# Patient Record
Sex: Male | Born: 1975 | Race: Black or African American | Hispanic: No | Marital: Single | Smoking: Current some day smoker
Health system: Southern US, Community
[De-identification: ages and names within clinical notes are randomized; demographics above are authoritative.]

---

## 1998-09-12 ENCOUNTER — Emergency Department (HOSPITAL_COMMUNITY): Admission: EM | Admit: 1998-09-12 | Discharge: 1998-09-12 | Payer: Self-pay | Admitting: Internal Medicine

## 2000-12-24 ENCOUNTER — Emergency Department (HOSPITAL_COMMUNITY): Admission: EM | Admit: 2000-12-24 | Discharge: 2000-12-24 | Payer: Self-pay | Admitting: Emergency Medicine

## 2006-07-02 ENCOUNTER — Encounter: Admission: RE | Admit: 2006-07-02 | Discharge: 2006-07-02 | Payer: Self-pay | Admitting: Gastroenterology

## 2006-11-13 ENCOUNTER — Emergency Department (HOSPITAL_COMMUNITY): Admission: EM | Admit: 2006-11-13 | Discharge: 2006-11-13 | Payer: Self-pay | Admitting: Emergency Medicine

## 2009-05-19 ENCOUNTER — Emergency Department (HOSPITAL_COMMUNITY): Admission: EM | Admit: 2009-05-19 | Discharge: 2009-05-19 | Payer: Self-pay | Admitting: Emergency Medicine

## 2009-05-23 ENCOUNTER — Ambulatory Visit: Payer: Self-pay | Admitting: *Deleted

## 2009-05-23 ENCOUNTER — Inpatient Hospital Stay (HOSPITAL_COMMUNITY): Admission: RE | Admit: 2009-05-23 | Discharge: 2009-05-26 | Payer: Self-pay | Admitting: *Deleted

## 2010-07-24 ENCOUNTER — Emergency Department (HOSPITAL_COMMUNITY): Admission: EM | Admit: 2010-07-24 | Discharge: 2010-07-24 | Payer: Self-pay | Admitting: Emergency Medicine

## 2010-09-16 IMAGING — CR DG CHEST 2V
2 series · 2 of 2 positions shown · non-contrast
Comparison: None.

CLINICAL DATA: Shortness of breath.  Smoker.

CHEST - 2 VIEW

[w chest pa]
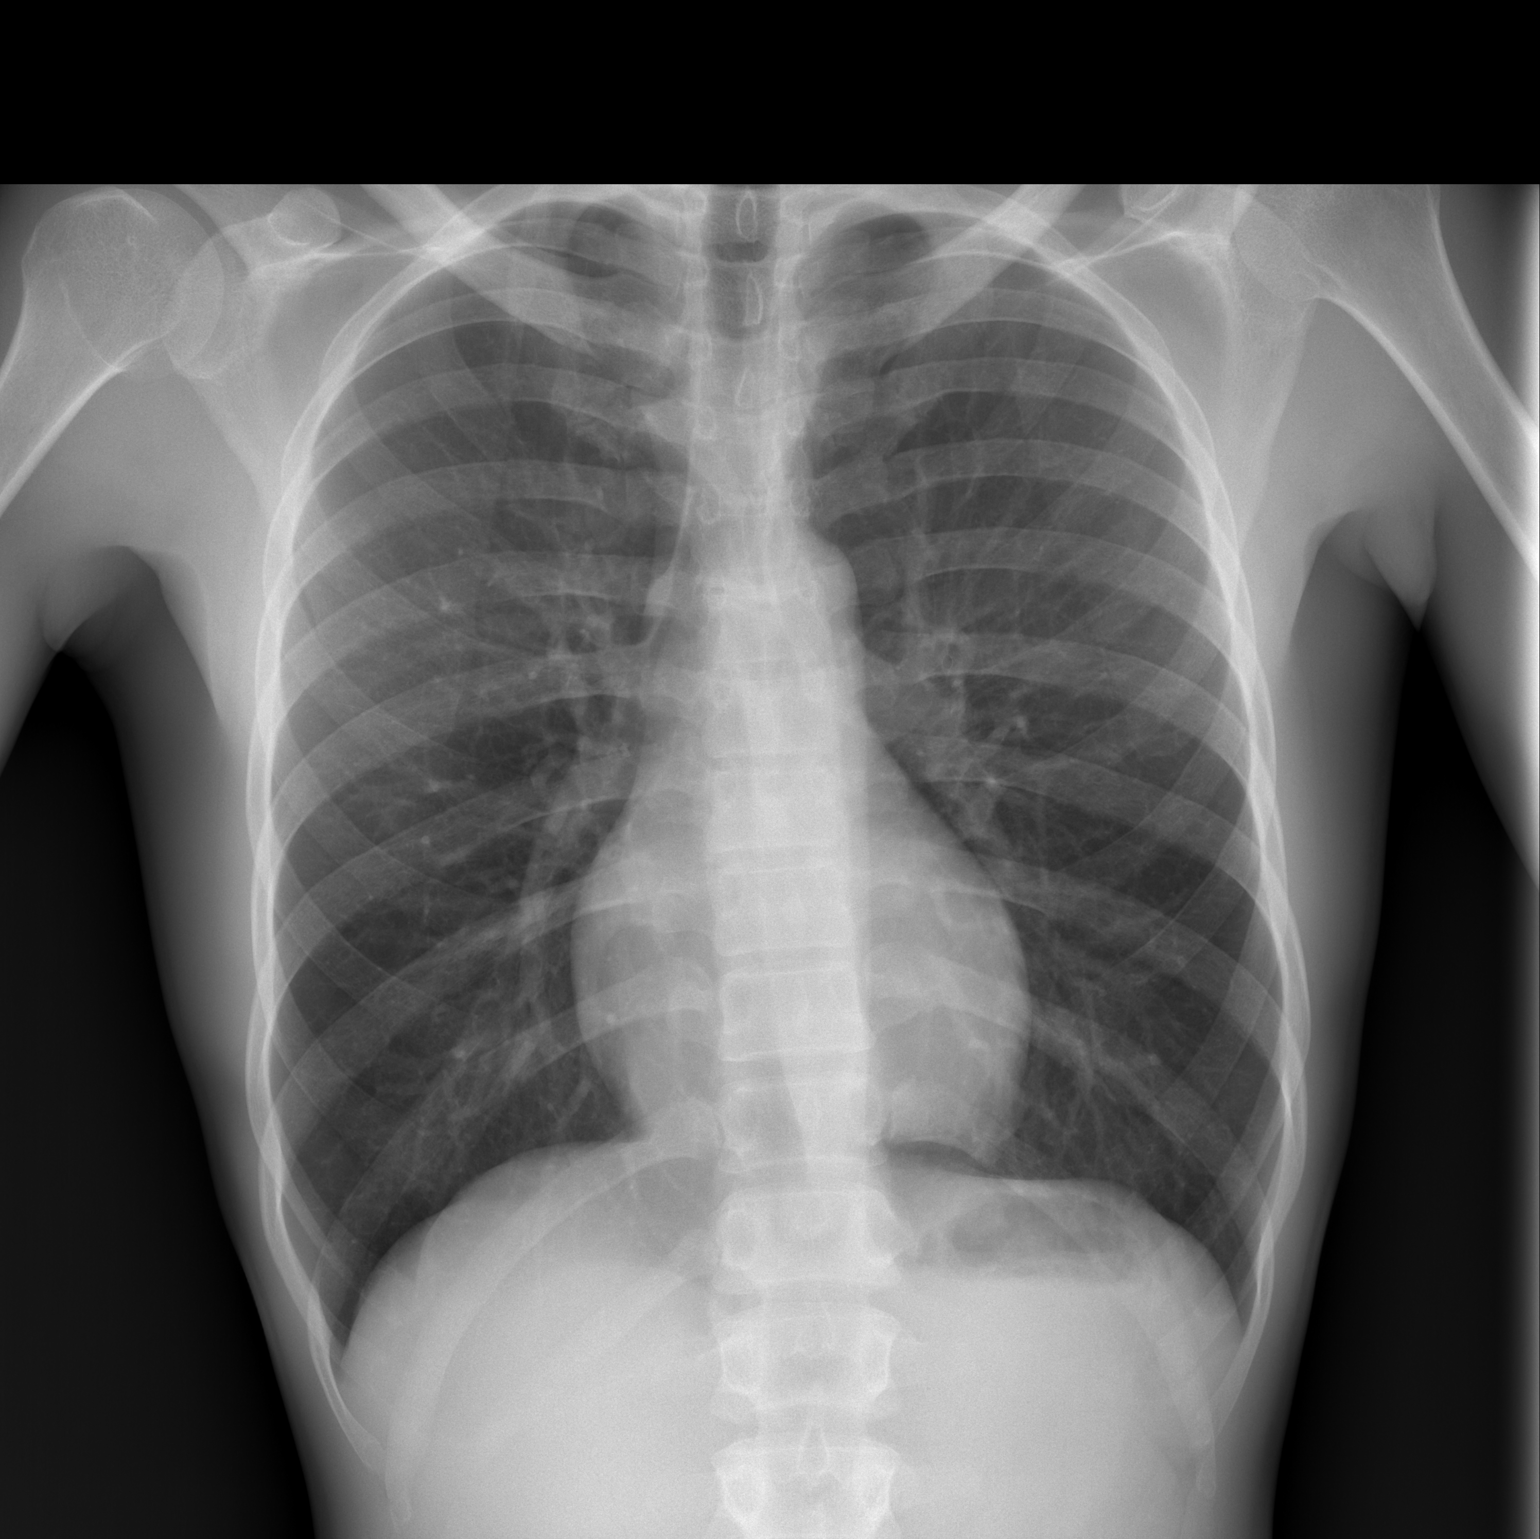

[w chest lat]
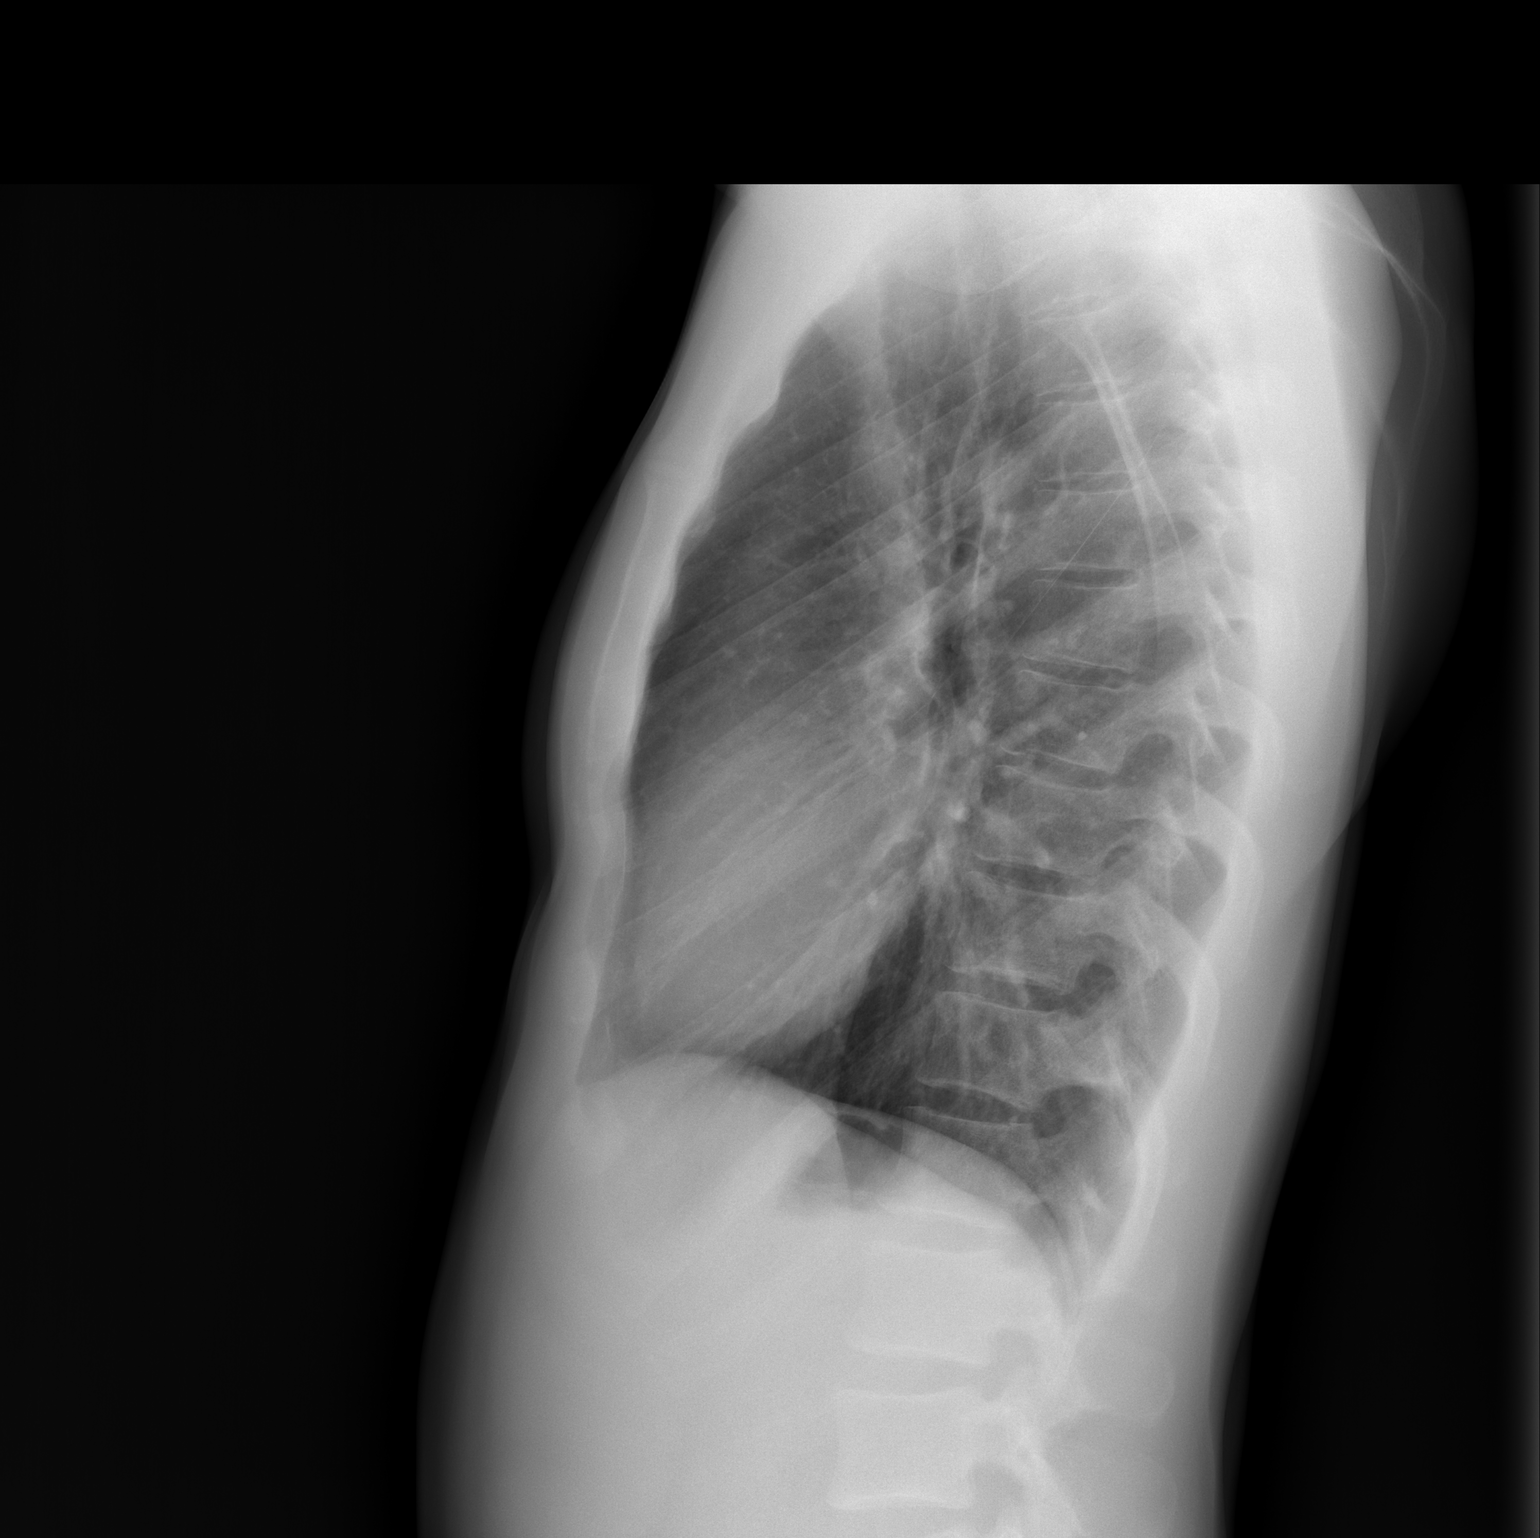

[2 of 2 positions shown; findings below may reference images not displayed]

FINDINGS: Normal sized heart.  Clear lungs.  Minimal central
peribronchial thickening and minimal hyperexpansion of the lungs.
Mild scoliosis.
IMPRESSION: Minimal changes of COPD and chronic bronchitis.  No acute
abnormality.

## 2010-12-21 LAB — CBC
MCHC: 33.1 g/dL (ref 30.0–36.0)
MCV: 84 fL (ref 78.0–100.0)
Platelets: 297 10*3/uL (ref 150–400)
RDW: 14 % (ref 11.5–15.5)

## 2010-12-21 LAB — COMPREHENSIVE METABOLIC PANEL
AST: 18 U/L (ref 0–37)
Albumin: 4.3 g/dL (ref 3.5–5.2)
CO2: 36 mEq/L — ABNORMAL HIGH (ref 19–32)
Calcium: 9.4 mg/dL (ref 8.4–10.5)
Creatinine, Ser: 1.04 mg/dL (ref 0.4–1.5)
GFR calc Af Amer: >60 mL/min (ref >60)
GFR calc non Af Amer: >60 mL/min (ref >60)
Total Protein: 7.5 g/dL (ref 6.0–8.3)

## 2012-07-24 ENCOUNTER — Emergency Department (HOSPITAL_COMMUNITY)
Admission: EM | Admit: 2012-07-24 | Discharge: 2012-07-24 | Disposition: A | Discharge status: Home / Self Care | Payer: 59 | Attending: Emergency Medicine | Admitting: Emergency Medicine

## 2012-07-24 ENCOUNTER — Encounter (HOSPITAL_COMMUNITY): Payer: Self-pay | Admitting: Emergency Medicine

## 2012-07-24 DIAGNOSIS — J029 Acute pharyngitis, unspecified: Secondary | ICD-10-CM | POA: Insufficient documentation

## 2012-07-24 DIAGNOSIS — F17200 Nicotine dependence, unspecified, uncomplicated: Secondary | ICD-10-CM | POA: Insufficient documentation

## 2012-07-24 DIAGNOSIS — J069 Acute upper respiratory infection, unspecified: Secondary | ICD-10-CM | POA: Insufficient documentation

## 2012-07-24 DIAGNOSIS — B370 Candidal stomatitis: Secondary | ICD-10-CM | POA: Insufficient documentation

## 2012-07-24 DIAGNOSIS — R6889 Other general symptoms and signs: Secondary | ICD-10-CM

## 2012-07-24 DIAGNOSIS — R197 Diarrhea, unspecified: Secondary | ICD-10-CM | POA: Insufficient documentation

## 2012-07-24 DIAGNOSIS — R52 Pain, unspecified: Secondary | ICD-10-CM | POA: Insufficient documentation

## 2012-07-24 DIAGNOSIS — R112 Nausea with vomiting, unspecified: Secondary | ICD-10-CM | POA: Insufficient documentation

## 2012-07-24 MED ORDER — IBUPROFEN 600 MG PO TABS: 600.0000 mg | ORAL_TABLET | Freq: Four times a day (QID) | ORAL | Status: DC | PRN

## 2012-07-24 MED ORDER — IBUPROFEN 800 MG PO TABS
800.0000 mg | ORAL_TABLET | Freq: Once | ORAL | Status: AC
  Administered 2012-07-24: 800 mg via ORAL
  Filled 2012-07-24: qty 1

## 2012-07-24 MED ORDER — FLUCONAZOLE 200 MG PO TABS: 200.0000 mg | ORAL_TABLET | Freq: Every day | ORAL | Status: AC

## 2012-07-24 MED ORDER — ONDANSETRON HCL 4 MG PO TABS: 4.0000 mg | ORAL_TABLET | Freq: Four times a day (QID) | ORAL | Status: DC

## 2012-07-24 MED ORDER — BENADRYL 25 MG PO TABS: 25.0000 mg | ORAL_TABLET | Freq: Four times a day (QID) | ORAL | Status: DC | PRN

## 2012-07-24 MED ORDER — IBUPROFEN 800 MG PO TABS: 800.0000 mg | ORAL_TABLET | Freq: Once | ORAL | Status: DC

## 2012-07-24 MED ORDER — ALBUTEROL SULFATE HFA 108 (90 BASE) MCG/ACT IN AERS
2.0000 | INHALATION_SPRAY | RESPIRATORY_TRACT | Status: DC | PRN
  Administered 2012-07-24: 2 via RESPIRATORY_TRACT
  Filled 2012-07-24: qty 6.7

## 2012-07-24 NOTE — ED Notes (Signed)
Pt presenting to ed with c/o body aches and pt states he can't sleep x 2 days. Pt states he feels like he has mucus in his chest but he has non-productive cough.

## 2012-07-24 NOTE — ED Provider Notes (Signed)
History     CSN: [id number]  Arrival date & time 07/24/12  1415   First MD Initiated Contact with Patient 07/24/12 1502      Chief Complaint  Patient presents with  . bodyaches   . flulike symptoms    (Consider location/radiation/quality/duration/timing/severity/associated sxs/prior treatment) Patient is a 36 y.o. male presenting with URI. The history is provided by the patient. No language interpreter was used.  URI The primary symptoms include fever, fatigue, ear pain, sore throat, cough, nausea, vomiting and arthralgias. Primary symptoms do not include headaches, swollen glands, wheezing or abdominal pain. The current episode started 3 to 5 days ago. This is a new problem. The problem has been gradually improving.  Symptoms associated with the illness include plugged ear sensation, sinus pressure and rhinorrhea. The illness is not associated with chills, facial pain or congestion.   36yo male with n/v x 1 diarrhea x 1 which has resolved. Also upper respiratory symptoms, low grade fever, rhinorrhea, sore throat, sneezing, ear fullness, and cough, insomnia x 3 days.  Took nyquil 3 days ago with some relief.   No pcp. Smoker.  Non toxic appearance.   History reviewed. No pertinent past medical history.  History reviewed. No pertinent past surgical history.  No family history on file.  History  Substance Use Topics  . Smoking status: Current Some Day Smoker  . Smokeless tobacco: Not on file  . Alcohol Use: Yes     Comment: rarely      Review of Systems  Constitutional: Positive for fever and fatigue. Negative for chills.  HENT: Positive for ear pain, sore throat, rhinorrhea and sinus pressure. Negative for congestion.   Eyes: Negative.   Respiratory: Positive for cough. Negative for wheezing.   Cardiovascular: Negative.   Gastrointestinal: Positive for nausea, vomiting and diarrhea. Negative for abdominal pain and constipation.  Musculoskeletal: Positive for arthralgias.  Negative for back pain.  Neurological: Negative.  Negative for headaches.  Psychiatric/Behavioral: Negative.   All other systems reviewed and are negative.    Allergies  Review of patient's allergies indicates no known allergies.  Home Medications   Current Outpatient Rx  Name  Route  Sig  Dispense  Refill  . NYQUIL MULTI-SYMPTOM PO   Oral   Take 2 capsules by mouth once.           BP 120/80  Pulse 99  Temp 99.6 F (37.6 C) (Oral)  Resp 20  SpO2 99%  Physical Exam  Nursing note and vitals reviewed. Constitutional: He is oriented to person, place, and time. He appears well-developed and well-nourished.  HENT:  Head: Normocephalic.  Eyes: Conjunctivae normal and EOM are normal. Pupils are equal, round, and reactive to light.  Neck: Normal range of motion. Neck supple.  Cardiovascular: Normal rate.   Pulmonary/Chest: Effort normal. No respiratory distress. He has no wheezes.       Course bs   Abdominal: Soft. Bowel sounds are normal. He exhibits no distension.  Musculoskeletal: Normal range of motion. He exhibits tenderness.       General Body aches   Neurological: He is alert and oriented to person, place, and time.  Skin: Skin is warm and dry.  Psychiatric: He has a normal mood and affect.    ED Course  Procedures (including critical care time)  Labs Reviewed - No data to display No results found.   No diagnosis found.    MDM  36yo male with Upper respiratory symptoms and general body aches. Bronchitis and  oral thursh.  Ibuprofen, benadryl and inhaler and diflucan.    Understands to return for SOB, fever n/v.  Follow up with pcp of choice as needed.  Non toxic appearance.         Remi Haggard, NP 07/25/12 [phone]

## 2012-07-25 NOTE — ED Provider Notes (Signed)
Medical screening examination/treatment/procedure(s) were performed by non-physician practitioner and as supervising physician I was immediately available for consultation/collaboration.  Victorino Fatzinger, MD 07/25/12 1523 

## 2017-05-01 ENCOUNTER — Ambulatory Visit (HOSPITAL_COMMUNITY)
Admission: RE | Admit: 2017-05-01 | Discharge: 2017-05-01 | Disposition: A | Discharge status: Home / Self Care | Payer: Federal, State, Local not specified - Other | Attending: Psychiatry | Admitting: Psychiatry

## 2017-05-01 ENCOUNTER — Emergency Department (HOSPITAL_COMMUNITY)
Admission: EM | Admit: 2017-05-01 | Discharge: 2017-05-02 | Disposition: A | Discharge status: Home / Self Care | Payer: Self-pay | Attending: Emergency Medicine | Admitting: Emergency Medicine

## 2017-05-01 DIAGNOSIS — F329 Major depressive disorder, single episode, unspecified: Secondary | ICD-10-CM | POA: Insufficient documentation

## 2017-05-01 DIAGNOSIS — F1414 Cocaine abuse with cocaine-induced mood disorder: Secondary | ICD-10-CM | POA: Diagnosis present

## 2017-05-01 DIAGNOSIS — R45851 Suicidal ideations: Secondary | ICD-10-CM | POA: Insufficient documentation

## 2017-05-01 DIAGNOSIS — F333 Major depressive disorder, recurrent, severe with psychotic symptoms: Secondary | ICD-10-CM | POA: Insufficient documentation

## 2017-05-01 DIAGNOSIS — F17210 Nicotine dependence, cigarettes, uncomplicated: Secondary | ICD-10-CM | POA: Insufficient documentation

## 2017-05-01 DIAGNOSIS — Z1389 Encounter for screening for other disorder: Secondary | ICD-10-CM | POA: Insufficient documentation

## 2017-05-01 NOTE — ED Provider Notes (Signed)
[facility]-EMERGENCY DEPT Provider Note   CSN: [id number] Arrival date & time: 05/01/17  2149    History   Chief Complaint Chief Complaint  Patient presents with  . Depression    HPI Nathan Kent is a 41 y.o. male.  41 year old male presents to the emergency department for worsening depression. He was seen at behavioral health earlier today and recommended for inpatient treatment. No beds available. He denies any suicidal or homicidal thoughts. No auditory or visual hallucinations. He denies pain. He reports injury hours that he is "just tired of being depressed". He states that he was on medication previously, but discontinued this some time ago. He has no additional complaints today. Patient calm and cooperative.      No past medical history on file.  There are no active problems to display for this patient.   No past surgical history on file.    Home Medications    Prior to Admission medications   Medication Sig Start Date End Date Taking? Authorizing Provider  BENADRYL 25 MG tablet Take 1 tablet (25 mg total) by mouth every 6 (six) hours as needed for sleep. Patient not taking: Reported on 05/01/2017 07/24/12   Jethro Bastos, NP  ibuprofen (ADVIL,MOTRIN) 600 MG tablet Take 1 tablet (600 mg total) by mouth every 6 (six) hours as needed for pain. Patient not taking: Reported on 05/01/2017 07/24/12   Jethro Bastos, NP  ondansetron (ZOFRAN) 4 MG tablet Take 1 tablet (4 mg total) by mouth every 6 (six) hours. Patient not taking: Reported on 05/01/2017 07/24/12   Jethro Bastos, NP    Family History No family history on file.  Social History Social History  Substance Use Topics  . Smoking status: Current Some Day Smoker  . Smokeless tobacco: Not on file  . Alcohol use Yes     Comment: rarely     Allergies   Patient has no known allergies.   Review of Systems Review of Systems Ten systems reviewed and are negative for acute change, except as noted in the HPI.      Physical Exam Updated Vital Signs BP 130/90 (BP Location: Right Arm)   Pulse 83   Resp 14   Ht 5\' 10"  (1.778 m)   Wt 67.6 kg (149 lb)   SpO2 100%   BMI 21.38 kg/m   Physical Exam  Constitutional: He is oriented to person, place, and time. He appears well-developed and well-nourished. No distress.  HENT:  Head: Normocephalic and atraumatic.  Eyes: Conjunctivae and EOM are normal. No scleral icterus.  Neck: Normal range of motion.  Cardiovascular: Normal rate, regular rhythm and intact distal pulses.   Pulmonary/Chest: Effort normal. No respiratory distress.  Musculoskeletal: Normal range of motion.  Neurological: He is alert and oriented to person, place, and time. He exhibits normal muscle tone. Coordination normal.  Skin: Skin is warm and dry. No rash noted. He is not diaphoretic. No erythema. No pallor.  Psychiatric: His behavior is normal. He exhibits a depressed mood.  No SI/HI  Nursing note and vitals reviewed.    ED Treatments / Results  Labs (all labs ordered are listed, but only abnormal results are displayed) Labs Reviewed  CBC WITH DIFFERENTIAL/PLATELET - Abnormal; Notable for the following:       Result Value   Hemoglobin 12.2 (*)    HCT 35.4 (*)    MCV 76.1 (*)    All other components within normal limits  COMPREHENSIVE METABOLIC PANEL - Abnormal; Notable  for the following:    Calcium 8.1 (*)    Total Protein 5.4 (*)    Albumin 3.2 (*)    AST 12 (*)    All other components within normal limits  ACETAMINOPHEN LEVEL - Abnormal; Notable for the following:    Acetaminophen (Tylenol), Serum <10 (*)    All other components within normal limits  ETHANOL  SALICYLATE LEVEL  RAPID URINE DRUG SCREEN, HOSP PERFORMED    EKG  EKG Interpretation None       Radiology No results found.  Procedures Procedures (including critical care time)  Medications Ordered in ED Medications - No data to display   Initial Impression / Assessment and Plan / ED  Course  I have reviewed the triage vital signs and the nursing notes.  Pertinent labs & imaging results that were available during my care of the patient were reviewed by me and considered in my medical decision making (see chart for details).     41 year old male presents for medical clearance after evaluation at behavioral health. He is pending inpatient placement for depression management. No SI/HI. The patient has been medically cleared. Disposition to be determined by oncoming ED provider   Final Clinical Impressions(s) / ED Diagnoses   Final diagnoses:  Depression, unspecified depression type    New Prescriptions New Prescriptions   No medications on file     Antony MaduraHumes, Aaima Gaddie, PA-C 05/02/17 0215    Melene PlanFloyd, Dan, DO 05/02/17 [phone]

## 2017-05-01 NOTE — ED Triage Notes (Signed)
Pt c/o of worsening depression and anxiety intermittently since 2010. Denies SI/HI/A/VH. Pt denies pain. Pt denies any new changes, states "I'm just tired of being depressed"

## 2017-05-01 NOTE — BH Assessment (Addendum)
Tele Assessment Note   Nathan Kent is an 41 y.o.single male, who voluntarily came into [facility], with his mother.  Patient reported seeking assistance, due to increases in depressive symptoms.  Patient denied SI when initially speaking with Counselor.  Per Nira Conn, NP Patient reported SI, without a plan during medical clearance.  Patient reported experiencing auditory hallucinations consisting of voices, laughter, and people talking about him, within various settings.   Patient reported the consumption of a 40 oz container of Alcohol on the weekends.  Patient reported ongoing and current experiences with depressive symptoms, such as fatigue, tearful, feelings of worthlessness, guilt, and recurrent thoughts.   Patient denies HI/VH, self-injurious behaviors, or access to weapons.    Patient reported currently being employed an residing with his mother.  Patient reported currently having a bachelor's degree and returning to school to obtain an associate's degree for career advancement.   Patient identified supportive factors, such as his mother.  Patient reported the utilization of coping skills, such as going to sports activities.   Per medical records, Patient received inpatient treatment at [facility] in 2010 for Panic Disorder.  Patient reported no other inpatient treatment.  Patient reported currently receiving no services from outpatient providers.    During assessment, Patient was calm and cooperative. Patient was oriented to the person, situation, location, and place.  Patient's speech was logical, coherent, slow, soft, and pressured.  Patient's eye contact was fair.  Patient's mood appeared to be depressed, despaired, and helpless.  Patient appeared to be unimpaired. Patient reported struggling with functioning due to increases in depressive symptoms.     Diagnosis: Major depressive disorder, recurrent, severe with psychotic features  Per Nira Conn, NP: Patient meets criteria for inpatient  treatment.     Past Medical History: No past medical history on file.  No past surgical history on file.  Family History: No family history on file.  Social History:  reports that he has been smoking.  He does not have any smokeless tobacco history on file. He reports that he drinks alcohol. He reports that he does not use drugs.  Additional Social History:  Alcohol / Drug Use Pain Medications: See MAR Prescriptions: See MAR Over the Counter: See MAR History of alcohol / drug use?: Yes Longest period of sobriety (when/how long): 1 week Substance #1 Name of Substance 1: Alcohol 1 - Age of First Use: Unknown 1 - Amount (size/oz): 1 40oz on the weekend 1 - Frequency: 1x Weekly 1 - Duration: Ongoing 1 - Last Use / Amount: 04/26/2017  CIWA:   COWS:    PATIENT STRENGTHS: (choose at least two) Ability for insight Average or above average intelligence Communication skills Financial means General fund of knowledge Motivation for treatment/growth Physical Health Supportive family/friends Work skills  Allergies: No Known Allergies  Home Medications:  (Not in a hospital admission)  OB/GYN Status:  No LMP for male patient.  General Assessment Data Location of Assessment: [facility] Assessment Services TTS Assessment: In system Is this a Tele or Face-to-Face Assessment?: Tele Assessment Is this an Initial Assessment or a Re-assessment for this encounter?: Initial Assessment Marital status: Single Is patient pregnant?: No Pregnancy Status: No Living Arrangements: Parent (Pt. reports living with Mother) Can pt return to current living arrangement?: Yes Admission Status: Voluntary Is patient capable of signing voluntary admission?: Yes Referral Source: Self/Family/Friend Insurance type: None  Medical Screening Exam [facility] Walk-in ONLY) Medical Exam completed: Yes  Crisis Care Plan Living Arrangements: Parent (Pt. reports  living with Mother) Legal Guardian: Other: (Self) Name  of Psychiatrist: None Name of Therapist: None  Education Status Is patient currently in school?: No Current Grade: N/A Highest grade of school patient has completed: College Name of school: [employer] Contact person: n/a  Risk to self with the past 6 months Suicidal Ideation: Yes-Currently Present (Pt. reports to Nira ConnJason Berry, NP) Has patient been a risk to self within the past 6 months prior to admission? : Yes (Pt. reports to Nira ConnJason Berry, NP) Suicidal Intent: Yes-Currently Present (Pt. reports to Nira ConnJason Berry, NP) Has patient had any suicidal intent within the past 6 months prior to admission? : Yes (Pt. reports to Nira ConnJason Berry, NP) Is patient at risk for suicide?: Yes (Pt. reports to Nira ConnJason Berry, NP) Suicidal Plan?: No (Patient denies) Has patient had any suicidal plan within the past 6 months prior to admission? : No Access to Means: No (Patient denies) What has been your use of drugs/alcohol within the last 12 months?: Pt. reports Alcohol Previous Attempts/Gestures: No How many times?: 0 Other Self Harm Risks: Patient denies Triggers for Past Attempts: None known Intentional Self Injurious Behavior: None Family Suicide History: No Recent stressful life event(s): Other (Comment) (Pt. reported ongoing stressors, but could not identify.) Persecutory voices/beliefs?: No Depression: Yes Depression Symptoms: Fatigue, Isolating, Feeling worthless/self pity, Loss of interest in usual pleasures, Guilt Substance abuse history and/or treatment for substance abuse?: No Suicide prevention information given to non-admitted patients: Not applicable  Risk to Others within the past 6 months Homicidal Ideation: No Does patient have any lifetime risk of violence toward others beyond the six months prior to admission? : No Thoughts of Harm to Others: No Current Homicidal Intent: No Current Homicidal Plan: No Access to Homicidal Means: No Identified Victim: Pt. denies History of harm to others?:  No Assessment of Violence: On admission Violent Behavior Description: Pt. denies Does patient have access to weapons?: No Criminal Charges Pending?: No Does patient have a court date: No Is patient on probation?: No  Psychosis Hallucinations: Auditory Delusions: None noted  Mental Status Report Appearance/Hygiene: Disheveled, Layered clothes Eye Contact: Fair Motor Activity: Freedom of movement, Rigidity Speech: Logical/coherent, Slow, Soft, Pressured Level of Consciousness: Quiet/awake Mood: Depressed, Despair, Helpless Affect: Fearful, Depressed Anxiety Level: None Thought Processes: Coherent, Relevant, Circumstantial Judgement: Unimpaired Orientation: Person, Place, Time, Situation Obsessive Compulsive Thoughts/Behaviors: None  Cognitive Functioning Concentration: Fair Memory: Recent Intact, Remote Intact IQ: Average Insight: Fair Impulse Control: Fair Appetite: Fair Weight Loss: 0 Weight Gain: 0 Sleep: No Change Total Hours of Sleep: 8 Vegetative Symptoms: None  ADLScreening [facility] Patient's cognitive ability adequate to safely complete daily activities?: Yes Patient able to express need for assistance with ADLs?: Yes Independently performs ADLs?: Yes (appropriate for developmental age)  Prior Inpatient Therapy Prior Inpatient Therapy: Yes Prior Therapy Dates: 2010 [facility] Reason for Treatment: Panic Disorder  Prior Outpatient Therapy Prior Outpatient Therapy: Yes Prior Therapy Dates: Unknown Prior Therapy Facilty/Provider(s): Unknown Reason for Treatment: Panic Disorder Does patient have an ACCT team?: No Does patient have Intensive In-House Services?  : No Does patient have [facility] services? : No Does patient have P4CC services?: No  ADL Screening (condition at time of admission) Patient's cognitive ability adequate to safely complete daily activities?: Yes Is the patient deaf or have difficulty  hearing?: No Does the patient have difficulty seeing, even when wearing glasses/contacts?: No Does the patient have difficulty concentrating, remembering, or making decisions?: Yes Patient able to express need  for assistance with ADLs?: Yes Does the patient have difficulty dressing or bathing?: No Independently performs ADLs?: Yes (appropriate for developmental age) Does the patient have difficulty walking or climbing stairs?: No Weakness of Legs: None Weakness of Arms/Hands: None  Home Assistive Devices/Equipment Home Assistive Devices/Equipment: None    Abuse/Neglect Assessment (Assessment to be complete while patient is alone) Physical Abuse: Denies Verbal Abuse: Denies Sexual Abuse: Denies Exploitation of patient/patient's resources: Denies Self-Neglect: Denies     Merchant navy officer (For Healthcare) Does Patient Have a Medical Advance Directive?: No Would patient like information on creating a medical advance directive?: No - Patient declined    Additional Information 1:1 In Past 12 Months?: No CIRT Risk: No Elopement Risk: No Does patient have medical clearance?: Yes     Disposition:  Disposition Initial Assessment Completed for this Encounter: Yes (Per Nira Conn, NP) Disposition of Patient: Inpatient treatment program Type of inpatient treatment program: Adult  Talbert Nan 05/01/2017 10:23 PM

## 2017-05-02 ENCOUNTER — Encounter (HOSPITAL_COMMUNITY): Payer: Self-pay | Admitting: Emergency Medicine

## 2017-05-02 DIAGNOSIS — F1414 Cocaine abuse with cocaine-induced mood disorder: Secondary | ICD-10-CM

## 2017-05-02 DIAGNOSIS — R440 Auditory hallucinations: Secondary | ICD-10-CM

## 2017-05-02 DIAGNOSIS — R45851 Suicidal ideations: Secondary | ICD-10-CM

## 2017-05-02 DIAGNOSIS — F17210 Nicotine dependence, cigarettes, uncomplicated: Secondary | ICD-10-CM

## 2017-05-02 DIAGNOSIS — F1910 Other psychoactive substance abuse, uncomplicated: Secondary | ICD-10-CM

## 2017-05-02 LAB — CBC WITH DIFFERENTIAL/PLATELET
BASOS PCT: 0 %
Basophils Absolute: 0 10*3/uL (ref 0.0–0.1)
EOS ABS: 0.2 10*3/uL (ref 0.0–0.7)
EOS PCT: 3 %
HCT: 35.4 % — ABNORMAL LOW (ref 39.0–52.0)
HEMOGLOBIN: 12.2 g/dL — AB (ref 13.0–17.0)
Lymphocytes Relative: 21 %
Lymphs Abs: 1.8 10*3/uL (ref 0.7–4.0)
MCH: 26.2 pg (ref 26.0–34.0)
MCHC: 34.5 g/dL (ref 30.0–36.0)
MCV: 76.1 fL — ABNORMAL LOW (ref 78.0–100.0)
Monocytes Absolute: 0.5 10*3/uL (ref 0.1–1.0)
Monocytes Relative: 6 %
NEUTROS PCT: 70 %
Neutro Abs: 5.8 10*3/uL (ref 1.7–7.7)
PLATELETS: 351 10*3/uL (ref 150–400)
RBC: 4.65 MIL/uL (ref 4.22–5.81)
RDW: 15 % (ref 11.5–15.5)
WBC: 8.4 10*3/uL (ref 4.0–10.5)

## 2017-05-02 LAB — COMPREHENSIVE METABOLIC PANEL
ALBUMIN: 3.2 g/dL — AB (ref 3.5–5.0)
ALK PHOS: 45 U/L (ref 38–126)
ALT: 18 U/L (ref 17–63)
ANION GAP: 5 (ref 5–15)
AST: 12 U/L — ABNORMAL LOW (ref 15–41)
BUN: 6 mg/dL (ref 6–20)
CALCIUM: 8.1 mg/dL — AB (ref 8.9–10.3)
CHLORIDE: 108 mmol/L (ref 101–111)
CO2: 28 mmol/L (ref 22–32)
Creatinine, Ser: 0.97 mg/dL (ref 0.61–1.24)
GFR calc non Af Amer: >60 mL/min (ref >60)
GLUCOSE: 97 mg/dL (ref 65–99)
Potassium: 3.7 mmol/L (ref 3.5–5.1)
SODIUM: 141 mmol/L (ref 135–145)
Total Bilirubin: 0.7 mg/dL (ref 0.3–1.2)
Total Protein: 5.4 g/dL — ABNORMAL LOW (ref 6.5–8.1)

## 2017-05-02 LAB — RAPID URINE DRUG SCREEN, HOSP PERFORMED
AMPHETAMINES: NOT DETECTED
BARBITURATES: NOT DETECTED
Benzodiazepines: NOT DETECTED
COCAINE: POSITIVE — AB
OPIATES: NOT DETECTED
TETRAHYDROCANNABINOL: NOT DETECTED

## 2017-05-02 LAB — ACETAMINOPHEN LEVEL: Acetaminophen (Tylenol), Serum: <10 ug/mL — ABNORMAL LOW (ref 10–30)

## 2017-05-02 LAB — ETHANOL: Alcohol, Ethyl (B): <5 mg/dL (ref <5)

## 2017-05-02 LAB — SALICYLATE LEVEL: Salicylate Lvl: <7 mg/dL (ref 2.8–30.0)

## 2017-05-02 NOTE — H&P (Signed)
Behavioral Health Medical Screening Exam  Nathan Kent is an 41 y.o. male.  Total Time spent with patient: 15 minutes  Psychiatric Specialty Exam: Physical Exam  Constitutional: He is oriented to person, place, and time. He appears well-developed and well-nourished. No distress.  HENT:  Head: Normocephalic and atraumatic.  Right Ear: External ear normal.  Left Ear: External ear normal.  Eyes: Pupils are equal, round, and reactive to light. Conjunctivae are normal. Right eye exhibits no discharge. Left eye exhibits no discharge. No scleral icterus.  Neck: Normal range of motion.  Cardiovascular: Normal rate, regular rhythm and normal heart sounds.   Respiratory: Effort normal and breath sounds normal. No respiratory distress.  Musculoskeletal: Normal range of motion.  Neurological: He is alert and oriented to person, place, and time.  Skin: Skin is warm and dry. He is not diaphoretic.  Psychiatric: His speech is normal. His mood appears anxious. His affect is blunt. His affect is not labile. Thought content is paranoid. Thought content is not delusional. Cognition and memory are normal. He expresses inappropriate judgment. He does not express impulsivity. He exhibits a depressed mood. He expresses suicidal ideation. He expresses no homicidal ideation. He expresses no suicidal plans.    Review of Systems  Psychiatric/Behavioral: Positive for depression, hallucinations, substance abuse and suicidal ideas. Negative for memory loss. The patient is nervous/anxious and has insomnia.   All other systems reviewed and are negative.   Blood pressure 123/84, pulse 79, temperature 98.6 F (37 C), resp. rate 18, SpO2 100 %.There is no height or weight on file to calculate BMI.  General Appearance: Disheveled  Eye Contact:  Fair  Speech:  Clear and Coherent and Normal Rate  Volume:  Decreased  Mood:  Anxious, Depressed, Dysphoric, Hopeless, Irritable and Worthless  Affect:  Blunt  Thought  Process:  Coherent and Goal Directed  Orientation:  Full (Time, Place, and Person)  Thought Content:  Logical and Hallucinations: Auditory  Suicidal Thoughts:  Yes.  without intent/plan  Homicidal Thoughts:  No  Memory:  Immediate;   Good Recent;   Fair Remote;   Fair  Judgement:  Fair  Insight:  Fair  Psychomotor Activity:  Decreased  Concentration: Concentration: Fair and Attention Span: Fair  Recall:  [employer] of Knowledge:Good  Language: Good  Akathisia:  No  Handed:  Right  AIMS (if indicated):     Assets:  Communication Skills Desire for Improvement Housing Leisure Time Physical Health  Sleep:       Musculoskeletal: Strength & Muscle Tone: within normal limits Gait & Station: normal   Blood pressure 123/84, pulse 79, temperature 98.6 F (37 C), resp. rate 18, SpO2 100 %.  Recommendations:  Based on my evaluation the patient does not appear to have an emergency medical condition.  Jackelyn Poling, NP 05/02/2017, 12:46 AM

## 2017-05-02 NOTE — Consult Note (Signed)
[facility] Consult   Reason for Consult:  Cocaine abuse with depression and hallucinations Referring Physician:  EDP Patient Identification: AVIEN TAHA MRN:  [id number] Principal Diagnosis: Cocaine abuse with cocaine-induced mood disorder [facility]) Diagnosis:   Patient Active Problem List   Diagnosis Date Noted  . Cocaine abuse with cocaine-induced mood disorder [facility]) [F14.14] 05/02/2017    Priority: High    Total Time spent with patient: 45 minutes  Subjective:   OUMAR MARCOTT is a 41 y.o. male patient does not warrant admission.  HPI:  41 yo male who came to the ED after using cocaine and having some suicidal ideations and auditory hallucinations.  Today, he is clear and coherent with no suicidal ideations or hallucinations.  Agreeable to go to outpatient substance abuse resources, resources provided.  No homicidal ideations or withdrawal symptoms, stable for discharge.  Past Psychiatric History: substance abuse  Risk to Self: Is patient at risk for suicide?: No Risk to Others:  None Prior Inpatient Therapy:  None Prior Outpatient Therapy:  NOne  Past Medical History: History reviewed. No pertinent past medical history. History reviewed. No pertinent surgical history. Family History: History reviewed. No pertinent family history. Family Psychiatric  History: unknown Social History:  History  Alcohol Use  . Yes    Comment: rarely     History  Drug Use No    Social History   Social History  . Marital status: Married    Spouse name: N/A  . Number of children: N/A  . Years of education: N/A   Social History Main Topics  . Smoking status: Current Some Day Smoker  . Smokeless tobacco: Never Used  . Alcohol use Yes     Comment: rarely  . Drug use: No  . Sexual activity: Not Asked   Other Topics Concern  . None   Social History Narrative  . None   Additional Social History:    Allergies:  No Known Allergies  Labs:  Results for orders placed  or performed during the hospital encounter of 05/01/17 (from the past 48 hour(s))  CBC with Differential     Status: Abnormal   Collection Time: 05/02/17  1:19 AM  Result Value Ref Range   WBC 8.4 4.0 - 10.5 K/uL   RBC 4.65 4.22 - 5.81 MIL/uL   Hemoglobin 12.2 (L) 13.0 - 17.0 g/dL   HCT 35.4 (L) 39.0 - 52.0 %   MCV 76.1 (L) 78.0 - 100.0 fL   MCH 26.2 26.0 - 34.0 pg   MCHC 34.5 30.0 - 36.0 g/dL   RDW 15.0 11.5 - 15.5 %   Platelets 351 150 - 400 K/uL   Neutrophils Relative % 70 %   Neutro Abs 5.8 1.7 - 7.7 K/uL   Lymphocytes Relative 21 %   Lymphs Abs 1.8 0.7 - 4.0 K/uL   Monocytes Relative 6 %   Monocytes Absolute 0.5 0.1 - 1.0 K/uL   Eosinophils Relative 3 %   Eosinophils Absolute 0.2 0.0 - 0.7 K/uL   Basophils Relative 0 %   Basophils Absolute 0.0 0.0 - 0.1 K/uL  Comprehensive metabolic panel     Status: Abnormal   Collection Time: 05/02/17  1:19 AM  Result Value Ref Range   Sodium 141 135 - 145 mmol/L   Potassium 3.7 3.5 - 5.1 mmol/L   Chloride 108 101 - 111 mmol/L   CO2 28 22 - 32 mmol/L   Glucose, Bld 97 65 - 99 mg/dL   BUN 6  6 - 20 mg/dL   Creatinine, Ser 0.97 0.61 - 1.24 mg/dL   Calcium 8.1 (L) 8.9 - 10.3 mg/dL   Total Protein 5.4 (L) 6.5 - 8.1 g/dL   Albumin 3.2 (L) 3.5 - 5.0 g/dL   AST 12 (L) 15 - 41 U/L   ALT 18 17 - 63 U/L   Alkaline Phosphatase 45 38 - 126 U/L   Total Bilirubin 0.7 0.3 - 1.2 mg/dL   GFR calc non Af Amer >60 >60 mL/min   GFR calc Af Amer >60 >60 mL/min    Comment: (NOTE) The eGFR has been calculated using the CKD EPI equation. This calculation has not been validated in all clinical situations. eGFR's persistently <60 mL/min signify possible Chronic Kidney Disease.    Anion gap 5 5 - 15  Ethanol     Status: None   Collection Time: 05/02/17  1:19 AM  Result Value Ref Range   Alcohol, Ethyl (B) <5 <5 mg/dL    Comment:        LOWEST DETECTABLE LIMIT FOR SERUM ALCOHOL IS 5 mg/dL FOR MEDICAL PURPOSES ONLY   Acetaminophen level     Status:  Abnormal   Collection Time: 05/02/17  1:19 AM  Result Value Ref Range   Acetaminophen (Tylenol), Serum <10 (L) 10 - 30 ug/mL    Comment:        THERAPEUTIC CONCENTRATIONS VARY SIGNIFICANTLY. A RANGE OF 10-30 ug/mL MAY BE AN EFFECTIVE CONCENTRATION FOR MANY PATIENTS. HOWEVER, SOME ARE BEST TREATED AT CONCENTRATIONS OUTSIDE THIS RANGE. ACETAMINOPHEN CONCENTRATIONS >150 ug/mL AT 4 HOURS AFTER INGESTION AND >50 ug/mL AT 12 HOURS AFTER INGESTION ARE OFTEN ASSOCIATED WITH TOXIC REACTIONS.   Salicylate level     Status: None   Collection Time: 05/02/17  1:19 AM  Result Value Ref Range   Salicylate Lvl <1.5 2.8 - 30.0 mg/dL  Rapid urine drug screen (hospital performed)     Status: Abnormal   Collection Time: 05/02/17  7:48 AM  Result Value Ref Range   Opiates NONE DETECTED NONE DETECTED   Cocaine POSITIVE (A) NONE DETECTED   Benzodiazepines NONE DETECTED NONE DETECTED   Amphetamines NONE DETECTED NONE DETECTED   Tetrahydrocannabinol NONE DETECTED NONE DETECTED   Barbiturates NONE DETECTED NONE DETECTED    Comment:        DRUG SCREEN FOR MEDICAL PURPOSES ONLY.  IF CONFIRMATION IS NEEDED FOR ANY PURPOSE, NOTIFY LAB WITHIN 5 DAYS.        LOWEST DETECTABLE LIMITS FOR URINE DRUG SCREEN Drug Class       Cutoff (ng/mL) Amphetamine      1000 Barbiturate      200 Benzodiazepine   056 Tricyclics       979 Opiates          300 Cocaine          300 THC              50     No current facility-administered medications for this encounter.    Current Outpatient Prescriptions  Medication Sig Dispense Refill  . BENADRYL 25 MG tablet Take 1 tablet (25 mg total) by mouth every 6 (six) hours as needed for sleep. (Patient not taking: Reported on 05/01/2017) 10 tablet 0  . ibuprofen (ADVIL,MOTRIN) 600 MG tablet Take 1 tablet (600 mg total) by mouth every 6 (six) hours as needed for pain. (Patient not taking: Reported on 05/01/2017) 30 tablet 0  . ondansetron (ZOFRAN) 4 MG tablet Take 1 tablet  (  4 mg total) by mouth every 6 (six) hours. (Patient not taking: Reported on 05/01/2017) 12 tablet 0    Musculoskeletal: Strength & Muscle Tone: within normal limits Gait & Station: normal Patient leans: N/A  Psychiatric Specialty Exam: Physical Exam  Constitutional: He is oriented to person, place, and time. He appears well-developed and well-nourished.  HENT:  Head: Normocephalic.  Neck: Normal range of motion.  Respiratory: Effort normal.  Musculoskeletal: Normal range of motion.  Neurological: He is alert and oriented to person, place, and time.  Psychiatric: He has a normal mood and affect. His speech is normal and behavior is normal. Judgment and thought content normal. Cognition and memory are normal.    Review of Systems  Psychiatric/Behavioral: Positive for substance abuse.  All other systems reviewed and are negative.   Blood pressure 119/76, pulse 73, temperature 98.1 F (36.7 C), temperature source Oral, resp. rate 18, height '5\' 10"'  (1.778 m), weight 67.6 kg (149 lb), SpO2 100 %.Body mass index is 21.38 kg/m.  General Appearance: Casual  Eye Contact:  Good  Speech:  Normal Rate  Volume:  Normal  Mood:  Euthymic  Affect:  Congruent  Thought Process:  Coherent and Descriptions of Associations: Intact  Orientation:  Full (Time, Place, and Person)  Thought Content:  WDL and Logical  Suicidal Thoughts:  No  Homicidal Thoughts:  No  Memory:  Immediate;   Good Recent;   Good Remote;   Good  Judgement:  Fair  Insight:  Fair  Psychomotor Activity:  Normal  Concentration:  Concentration: Good and Attention Span: Good  Recall:  Good  Fund of Knowledge:  Fair  Language:  Good  Akathisia:  No  Handed:  Right  AIMS (if indicated):     Assets:  Housing Leisure Time Physical Health Resilience Social Support  ADL's:  Intact  Cognition:  WNL  Sleep:        Treatment Plan Summary: Daily contact with patient to assess and evaluate symptoms and progress in  treatment, Medication management and Plan cocaine abuse with cocaine induced mood disorder:  -Crisis stabilization -Medication management:  None started as he needed to clear the cocaine -Individual and substance abuse counseling -Substance abuse resources provided  Disposition: No evidence of imminent risk to self or others at present.    Waylan Boga, NP 05/02/2017 11:33 AM  Patient seen face-to-face for psychiatric evaluation, chart reviewed and case discussed with the physician extender and developed treatment plan. Reviewed the information documented and agree with the treatment plan. Corena Pilgrim, MD

## 2017-05-02 NOTE — ED Notes (Signed)
Requested urine from patient. 

## 2017-05-02 NOTE — Discharge Instructions (Signed)
To help you maintain a sober lifestyle, a substance abuse treatment program may be beneficial to you.  Contact [facility] at your earliest opportunity to ask about enrolling in their program: ° °     [facility] °     [address] °     [place], [place] [address] °     [phone] °     New patients are seen at the walk-in clinic every Tuesday from 9:00 am - 12:00 pm °

## 2017-05-02 NOTE — ED Notes (Signed)
Patient changed into burgundy scrubs and wanded by security prior to transporting to [facility].

## 2017-05-02 NOTE — [facility] Suicide Risk Assessment (Signed)
Suicide Risk Assessment  Discharge Assessment   [facility] Discharge Suicide Risk Assessment   Principal Problem: Cocaine abuse with cocaine-induced mood disorder [facility]) Discharge Diagnoses:  Patient Active Problem List   Diagnosis Date Noted  . Cocaine abuse with cocaine-induced mood disorder [facility]) [F14.14] 05/02/2017    Priority: High    Total Time spent with patient: 45 minutes  Musculoskeletal: Strength & Muscle Tone: within normal limits Gait & Station: normal Patient leans: N/A  Psychiatric Specialty Exam: Physical Exam  Constitutional: He is oriented to person, place, and time. He appears well-developed and well-nourished.  HENT:  Head: Normocephalic.  Neck: Normal range of motion.  Respiratory: Effort normal.  Musculoskeletal: Normal range of motion.  Neurological: He is alert and oriented to person, place, and time.  Psychiatric: He has a normal mood and affect. His speech is normal and behavior is normal. Judgment and thought content normal. Cognition and memory are normal.    Review of Systems  Psychiatric/Behavioral: Positive for substance abuse.  All other systems reviewed and are negative.   Blood pressure 119/76, pulse 73, temperature 98.1 F (36.7 C), temperature source Oral, resp. rate 18, height 5\' 10"  (1.778 m), weight 67.6 kg (149 lb), SpO2 100 %.Body mass index is 21.38 kg/m.  General Appearance: Casual  Eye Contact:  Good  Speech:  Normal Rate  Volume:  Normal  Mood:  Euthymic  Affect:  Congruent  Thought Process:  Coherent and Descriptions of Associations: Intact  Orientation:  Full (Time, Place, and Person)  Thought Content:  WDL and Logical  Suicidal Thoughts:  No  Homicidal Thoughts:  No  Memory:  Immediate;   Good Recent;   Good Remote;   Good  Judgement:  Fair  Insight:  Fair  Psychomotor Activity:  Normal  Concentration:  Concentration: Good and Attention Span: Good  Recall:  Good  Fund of Knowledge:  Fair  Language:  Good  Akathisia:   No  Handed:  Right  AIMS (if indicated):     Assets:  Housing Leisure Time Physical Health Resilience Social Support  ADL's:  Intact  Cognition:  WNL  Sleep:       Mental Status Per Nursing Assessment::   On Admission:   cocaine abuse with depression and hallucinations  Demographic Factors:  Male  Loss Factors: NA  Historical Factors: NA  Risk Reduction Factors:   Sense of responsibility to family, Living with another person, especially a relative and Positive social support  Continued Clinical Symptoms:  None  Cognitive Features That Contribute To Risk:  None    Suicide Risk:  Minimal: No identifiable suicidal ideation.  Patients presenting with no risk factors but with morbid ruminations; may be classified as minimal risk based on the severity of the depressive symptoms    Plan Of Care/Follow-up recommendations:  Activity:  as tolerated Diet:  heart healthy diet  LORD, JAMISON, NP 05/02/2017, 11:38 AM

## 2017-05-02 NOTE — ED Notes (Signed)
Patient has no pain at this time. Patient states that he is still feeling depressed. He states he doesn't want to be depressed anymore.

## 2017-05-02 NOTE — ED Notes (Signed)
Pt still unable to give urine sample, but is aware that we need one.

## 2017-05-02 NOTE — ED Notes (Signed)
Pt oriented to room and unit.  Pt is soft spoken and somewhat withdrawn.  Pt complains of increasing anxiety and depression and contracts for safety.  15 minute checks and video monitoring in place.

## 2017-05-02 NOTE — BH Assessment (Signed)
[facility] Assessment Progress Note  Per Thedore Mins, MD, this pt does not require psychiatric hospitalization at this time.  Pt is to be discharged from [facility] with recommendation to follow up with [facility].  This has been included in pt's discharge instructions.  Pt's nurse, Kendal Hymen, has been notified.  Doylene Canning, MA Triage Specialist [phone]

## 2017-05-02 NOTE — ED Notes (Signed)
Pt discharged with resources.  Pt was in no distress at discharge.  All belongings were returned to pt.

## 2017-07-19 ENCOUNTER — Emergency Department (HOSPITAL_COMMUNITY)
Admission: EM | Admit: 2017-07-19 | Discharge: 2017-07-19 | Disposition: A | Discharge status: Home / Self Care | Payer: Self-pay | Attending: Emergency Medicine | Admitting: Emergency Medicine

## 2017-07-19 ENCOUNTER — Encounter (HOSPITAL_COMMUNITY): Payer: Self-pay | Admitting: Emergency Medicine

## 2017-07-19 ENCOUNTER — Emergency Department (HOSPITAL_COMMUNITY): Payer: Self-pay

## 2017-07-19 DIAGNOSIS — F17200 Nicotine dependence, unspecified, uncomplicated: Secondary | ICD-10-CM | POA: Insufficient documentation

## 2017-07-19 DIAGNOSIS — S62661B Nondisplaced fracture of distal phalanx of left index finger, initial encounter for open fracture: Secondary | ICD-10-CM

## 2017-07-19 DIAGNOSIS — Y9389 Activity, other specified: Secondary | ICD-10-CM | POA: Insufficient documentation

## 2017-07-19 DIAGNOSIS — Z23 Encounter for immunization: Secondary | ICD-10-CM | POA: Insufficient documentation

## 2017-07-19 DIAGNOSIS — W230XXA Caught, crushed, jammed, or pinched between moving objects, initial encounter: Secondary | ICD-10-CM | POA: Insufficient documentation

## 2017-07-19 DIAGNOSIS — S62639B Displaced fracture of distal phalanx of unspecified finger, initial encounter for open fracture: Secondary | ICD-10-CM

## 2017-07-19 DIAGNOSIS — S62631B Displaced fracture of distal phalanx of left index finger, initial encounter for open fracture: Secondary | ICD-10-CM | POA: Insufficient documentation

## 2017-07-19 DIAGNOSIS — Y990 Civilian activity done for income or pay: Secondary | ICD-10-CM | POA: Insufficient documentation

## 2017-07-19 DIAGNOSIS — Y9289 Other specified places as the place of occurrence of the external cause: Secondary | ICD-10-CM | POA: Insufficient documentation

## 2017-07-19 MED ORDER — ONDANSETRON HCL 4 MG/2ML IJ SOLN
4.0000 mg | Freq: Once | INTRAMUSCULAR | Status: AC
  Administered 2017-07-19: 4 mg via INTRAVENOUS
  Filled 2017-07-19: qty 2

## 2017-07-19 MED ORDER — CEFAZOLIN SODIUM-DEXTROSE 1-4 GM/50ML-% IV SOLN
1.0000 g | Freq: Once | INTRAVENOUS | Status: AC
  Administered 2017-07-19: 1 g via INTRAVENOUS
  Filled 2017-07-19: qty 50

## 2017-07-19 MED ORDER — LIDOCAINE HCL (PF) 1 % IJ SOLN
INTRAMUSCULAR | Status: AC
  Administered 2017-07-19: 18:00:00
  Filled 2017-07-19: qty 30

## 2017-07-19 MED ORDER — SODIUM CHLORIDE 0.9 % IV SOLN
Freq: Once | INTRAVENOUS | Status: AC
Start: 2017-07-19 — End: 2017-07-19
  Administered 2017-07-19: 14:00:00 via INTRAVENOUS

## 2017-07-19 MED ORDER — TETANUS-DIPHTH-ACELL PERTUSSIS 5-2.5-18.5 LF-MCG/0.5 IM SUSP
0.5000 mL | Freq: Once | INTRAMUSCULAR | Status: AC
  Administered 2017-07-19: 0.5 mL via INTRAMUSCULAR
  Filled 2017-07-19: qty 0.5

## 2017-07-19 MED ORDER — HYDROMORPHONE HCL 1 MG/ML IJ SOLN
0.5000 mg | Freq: Once | INTRAMUSCULAR | Status: AC
  Administered 2017-07-19: 0.5 mg via INTRAVENOUS
  Filled 2017-07-19: qty 1

## 2017-07-19 NOTE — ED Notes (Signed)
Surgeon at bedside for evaluation

## 2017-07-19 NOTE — ED Provider Notes (Signed)
[name]-EMERGENCY DEPT Provider Note   CSN: [id number] Arrival date & time: 07/19/17  1146     History   Chief Complaint Chief Complaint  Patient presents with  . Finger Injury    HPI Nathan Kent is a 41 y.o. male who presents to the ED with an injury to his finger. The injury is located on the left index finger. The injury occurred just prior to arrival at the patient's place of work. 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 Patient complains of pain and swelling to the area and a large laceration to the palmar aspect of the finger.   HPI  History reviewed. No pertinent past medical history.  Patient Active Problem List   Diagnosis Date Noted  . Cocaine abuse with cocaine-induced mood disorder (HCC) 05/02/2017    History reviewed. No pertinent surgical history.     Home Medications    Prior to Admission medications   Not on File    Family History No family history on file.  Social History Social History  Substance Use Topics  . Smoking status: Current Some Day Smoker  . Smokeless tobacco: Never Used  . Alcohol use Yes     Comment: rarely     Allergies   Patient has no known allergies.   Review of Systems Review of Systems  Musculoskeletal: Positive for arthralgias.  Skin: Positive for wound.  All other systems reviewed and are negative.    Physical Exam Updated Vital Signs BP (!) 160/99 (BP Location: Right Arm)   Pulse 88   Temp 98.5 F (36.9 C) (Oral)   Resp 18   SpO2 99%   Physical Exam  Constitutional: He appears well-developed and well-nourished.  HENT:  Head: Normocephalic.  Eyes: EOM are normal.  Neck: Neck supple.  Cardiovascular: Normal rate.   Pulmonary/Chest: Effort normal.  Musculoskeletal:       Left hand: He exhibits decreased range of motion, tenderness, laceration and swelling.        Hands: Laceration to the palmar aspect of the left index finger. Tender with range of motion and palpation. Bleeding controlled.   Neurological: He is alert.  Skin: Skin is warm and dry.  Psychiatric: He has a normal mood and affect.  Nursing note and vitals reviewed.    ED Treatments / Results  Labs (all labs ordered are listed, but only abnormal results are displayed) Labs Reviewed - No data to display  Radiology Dg Finger Index Left  Result Date: 07/19/2017 CLINICAL DATA:  41 year old male with pain and trauma to left index finger. EXAM: LEFT INDEX FINGER 2+V COMPARISON:  None. FINDINGS: Cortical irregularity along the base of the distal phalanx concerning for a minimally displaced fracture which may involve the articular surface. Fine osseous and soft tissue detail is obscured by overlying bandage material. : Cortical irregularity along the base of the distal phalanx concerning for minimally displaced fracture which may involve the articular surface. Correlate with point tenderness. Repeat radiographs may be obtained in 7-10 days if further confirmation is needed. Electronically Signed   By: Sande BrothersSerena  Chacko M.D.   On: 07/19/2017 12:48    Procedures: Wound soaked in NSS.  Procedures (including critical care time)  Medications Ordered in ED Medications  0.9 %  sodium chloride infusion (not administered)  Tdap (BOOSTRIX) injection 0.5 mL (not administered)  ceFAZolin (ANCEF) IVPB 1 g/50 mL  premix (not administered)  ondansetron (ZOFRAN) injection 4 mg (4 mg Intravenous Given 07/19/17 1338)  HYDROmorphone (DILAUDID) injection 0.5 mg (0.5 mg Intravenous Given 07/19/17 1338)     Initial Impression / Assessment and Plan / ED Course  I have reviewed the triage vital signs and the nursing notes.  1:20 pm Consult with Dr. Amanda Pea and he will see the patient for further evaluation at the [facility] ED. 1:35 pm Dr. Adriana Simas in to examine the patient. 1:40 pm report given to Janan Halter, charge  RN @ [facility] 1:45 pm Report given to Dr. Julieanne Manson @ [facility]  41 y.o. male with laceration and pain s/p crush injury to the left index finger stable for transfer to [facility] ED for further evaluation by the hand surgeon.  IV Ancef, dilaudid, zofran and tetanus administered while here in the ED.  Final Clinical Impressions(s) / ED Diagnoses   Final diagnoses:  Open nondisplaced fracture of distal phalanx of left index finger, initial encounter    New Prescriptions New Prescriptions   No medications on file     Janne Napoleon, NP 07/19/17 1436    Donnetta Hutching, MD 07/20/17 1017

## 2017-07-19 NOTE — ED Notes (Signed)
Dr. Amanda PeaGramig alerted the patient is now in a room.

## 2017-07-19 NOTE — ED Notes (Signed)
[facility] at [facility] via [facility].  NS infusing upon arrival.  Dressing on finger is clean, dry and intact.  Awaiting hand surgeon at this time.  Pt denies any needs at this time.  Will continue to monitor for any needs or changes.

## 2017-07-19 NOTE — ED Triage Notes (Signed)
Patient here with complaints of finger injury while at work. Injury to left pointer. Bleeding controlled.

## 2017-07-19 NOTE — ED Triage Notes (Signed)
[facility] Pt called family to advise them of transfer

## 2017-07-19 NOTE — Discharge Instructions (Signed)
Take medications and follow up as per Dr. Carlos LeveringGramig's directions.

## 2017-07-19 NOTE — Consult Note (Signed)
Reason for Consult:left index finger crush injury with open fracture and multiple wounds Referring Physician: ER staff Dr. Sherlie Ban is an 41 y.o. male.  HPI: 41 year old male with a crushing injury to his left index finger. The patient has marked soft tissue disarray and ecchymosis as well as an open fracture. I've been asked to see and treat him.  He notes no other injury.  He is alert and oriented and very appropriate. He is nice gentleman. This happened on the job today. A hard metal device crushed his finger  History reviewed. No pertinent past medical history.  History reviewed. No pertinent surgical history.  No family history on file.  Social History:  reports that he has been smoking.  He has never used smokeless tobacco. He reports that he drinks alcohol. He reports that he does not use drugs.  Allergies: No Known Allergies  Medications: I have reviewed the patient's current medications.  No results found for this or any previous visit (from the past 48 hour(s)).  Dg Finger Index Left  Result Date: 07/19/2017 CLINICAL DATA:  41 year old male with pain and trauma to left index finger. EXAM: LEFT INDEX FINGER 2+V COMPARISON:  None. FINDINGS: Cortical irregularity along the base of the distal phalanx concerning for a minimally displaced fracture which may involve the articular surface. Fine osseous and soft tissue detail is obscured by overlying bandage material. : Cortical irregularity along the base of the distal phalanx concerning for minimally displaced fracture which may involve the articular surface. Correlate with point tenderness. Repeat radiographs may be obtained in 7-10 days if further confirmation is needed. Electronically Signed   By: Sande Brothers M.D.   On: 07/19/2017 12:48    Review of Systems  Respiratory: Negative.   Cardiovascular: Negative.   Gastrointestinal: Negative.   Genitourinary: Negative.   Neurological: Negative.    Blood pressure  122/82, pulse 92, temperature 98.5 F (36.9 C), temperature source Oral, resp. rate 18, SpO2 99 %. Physical Exam Left index finger crushing injury with open fracture exposed tendon and soft tissue disarray.  I reviewed the x-rays show a fracture about the articular base of the DIP region.  The patient is alert and oriented in no acute distress. The patient complains of pain in the affected upper extremity.  The patient is noted to have a normal HEENT exam. Lung fields show equal chest expansion and no shortness of breath. Abdomen exam is nontender without distention. Lower extremity examination does not show any fracture dislocation or blood clot symptoms. Pelvis is stable and the neck and back are stable and nontender.   Assessment/Plan: Crushing injury left index finger. I would recommend operative irrigation debridement and repair structures as necessary. Patient understands and desires to proceed with a long detailed discussion in regards to all issues.  We are planning surgery for your upper extremity. The risk and benefits of surgery to include risk of bleeding, infection, anesthesia,  damage to normal structures and failure of the surgery to accomplish its intended goals of relieving symptoms and restoring function have been discussed in detail. With this in mind we plan to proceed. I have specifically discussed with the patient the pre-and postoperative regime and the dos and don'ts and risk and benefits in great detail. Risk and benefits of surgery also include risk of dystrophy(CRPS), chronic nerve pain, failure of the healing process to go onto completion and other inherent risks of surgery The relavent the pathophysiology of the disease/injury process, as well as  the alternatives for treatment and postoperative course of action has been discussed in great detail with the patient who desires to proceed.  We will do everything in our power to help you (the patient) restore function to  the upper extremity. It is a pleasure to see this patient today.    Procedure: diagnosis: crushing injury to the left index finger with open fracture Procedure: #1 irrigation and debridement open fracture left index finger this was an excisional debridement nature of an open fracture with curette knife and scissor this included skin subcutaneous tissue bone tendon and associated structures. #2 exploration and neuro lysis radial digital nerve and #3 exploration and neuro lysis ulnar digital nerve left index finger. #3 open treatment interarticular fracture distal interphalangeal joint. #4 complex volar flap reconstruction of a complex wound. This was an advancement flap retrograde. Description of the procedure: Patient went through a prep and drape with 2 separate Betadine scrub and paint's performed by myself about 2 L of irrigation placed through and through the area. A sterile field had been secured and irrigation and debridement of skin subcutaneous tissue and tendon as well as open fracture was comp was. Following this I performed evaluation of the flexor tendon the area was frayed but the tendon was intact. I debrided this accordingly. I performed a limited tendon lysis of the flexor tendon (profundus). Following this I performed a neuro lysis of the radial digital nerve and following this a neuro lysis of the ulnar digital nerve. I freed these nerves from metallic debris and other communicating contaminants. Following this we then performed additional irrigation.  Once this was complete we performed open treatment of the interarticular fracture. This was a distal interphalangeal joint open fracture treatment Next the patient underwent a volar flap reconstruction to cover the tendon. Combination of chromic complaint sutures were used. There are no problematic features. Once this was complete we then dressed wound with Adaptic, Xeroform, 4 x 4's gauze and a splint.  Patient return to the office to see  [place] in 9 days. We'll see in November 12 and asked for therapy on the same day. I would like for him to take Bactrim 1 by mouth twice a day 14 days. I've given him OxyIR to take for pain dispense #42. He will take 1-2 every 4-6 hours as needed for pain  He understands to keep the bandages clean and dry and he'll be out of work and to November 8. November 8 he can begin light duty which will be sedentary work may use of the affected left hand  He tolerated procedure well no problematic features    Keep bandage clean and dry.  Call for any problems.  No smoking.  Criteria for driving a car: you should be off your pain medicine for 7-8 hours, able to drive one handed(confident), thinking clearly and feeling able in your judgement to drive. Continue elevation as it will decrease swelling.  If instructed by MD move your fingers within the confines of the bandage/splint.  Use ice if instructed by your MD. Call immediately for any sudden loss of feeling in your hand/arm or change in functional abilities of the extremity.   We recommend that you to take vitamin C 1000 mg a day to promote healing. We also recommend that if you require  pain medicine that you take a stool softener to prevent constipation as most pain medicines will have constipation side effects. We recommend either Peri-Colace or Senokot and recommend that you also consider  adding MiraLAX as well to prevent the constipation affects from pain medicine if you are required to use them. These medicines are over the counter and may be purchased at a local pharmacy. A cup of yogurt and a probiotic can also be helpful during the recovery process as the medicines can disrupt your intestinal environment.   Karen ChafeGRAMIG III,[place] Marsico M 07/19/2017, 6:36 PM

## 2017-07-19 NOTE — ED Provider Notes (Signed)
[facility] Amanda Peagramig.  Injury to left second digit with open fracture.  Patient is  hemodynamically stable here in the ED.  Dr. Amanda Peagramig is in to see.   Margarita Grizzleay, Cruise Baumgardner, MD 07/19/17 [phone]

## 2018-11-16 IMAGING — CR DG FINGER INDEX 2+V*L*
3 series · 3 of 3 positions shown · non-contrast
Comparison: None.

CLINICAL DATA: 41-year-old male with pain and trauma to left index
finger.

EXAM:
LEFT INDEX FINGER 2+V

[x finger pa left]
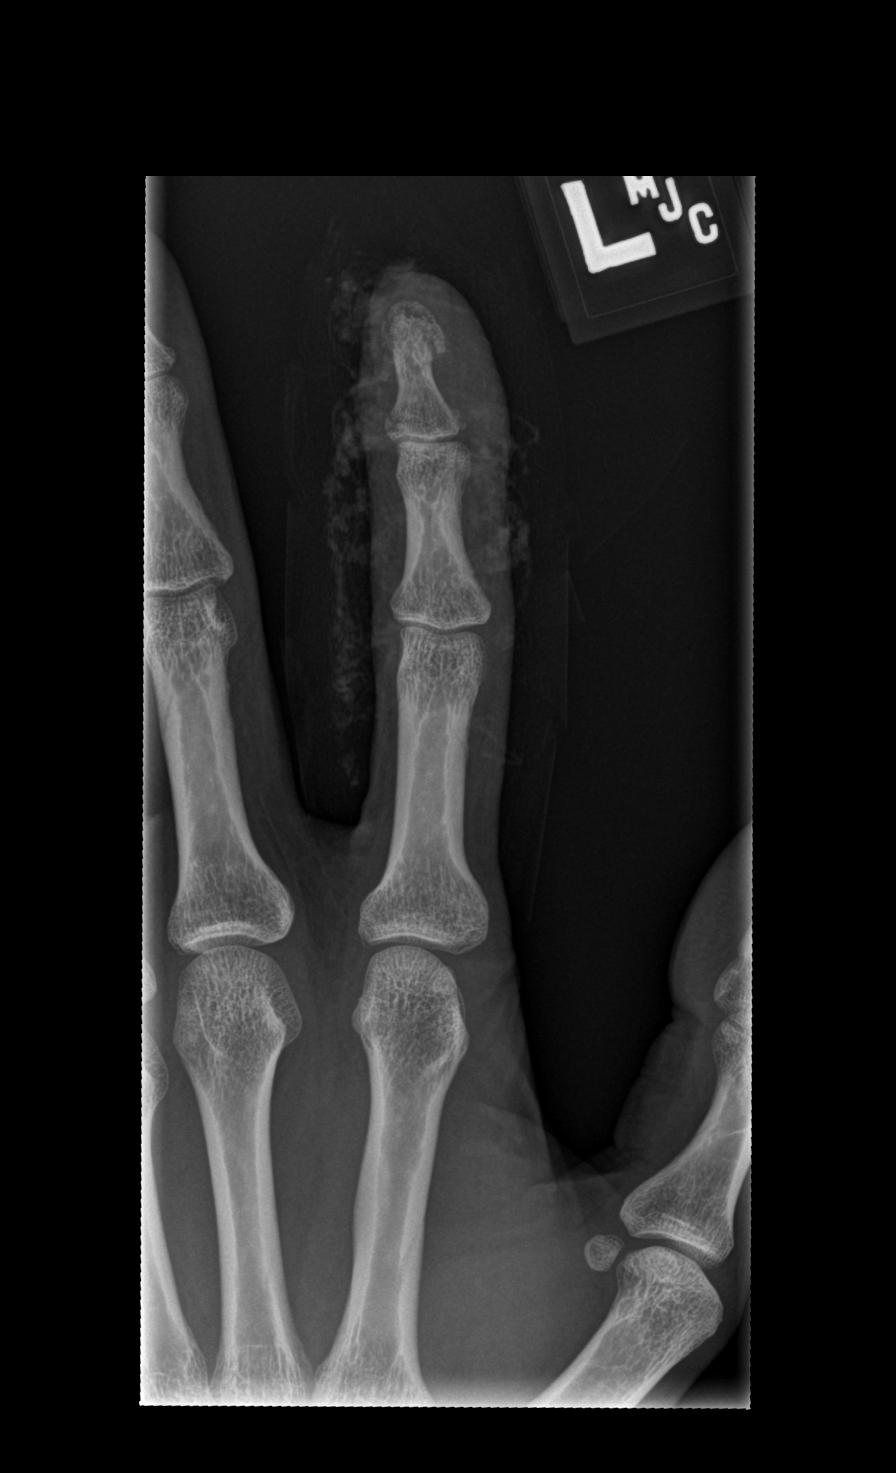

[x finger obl left]
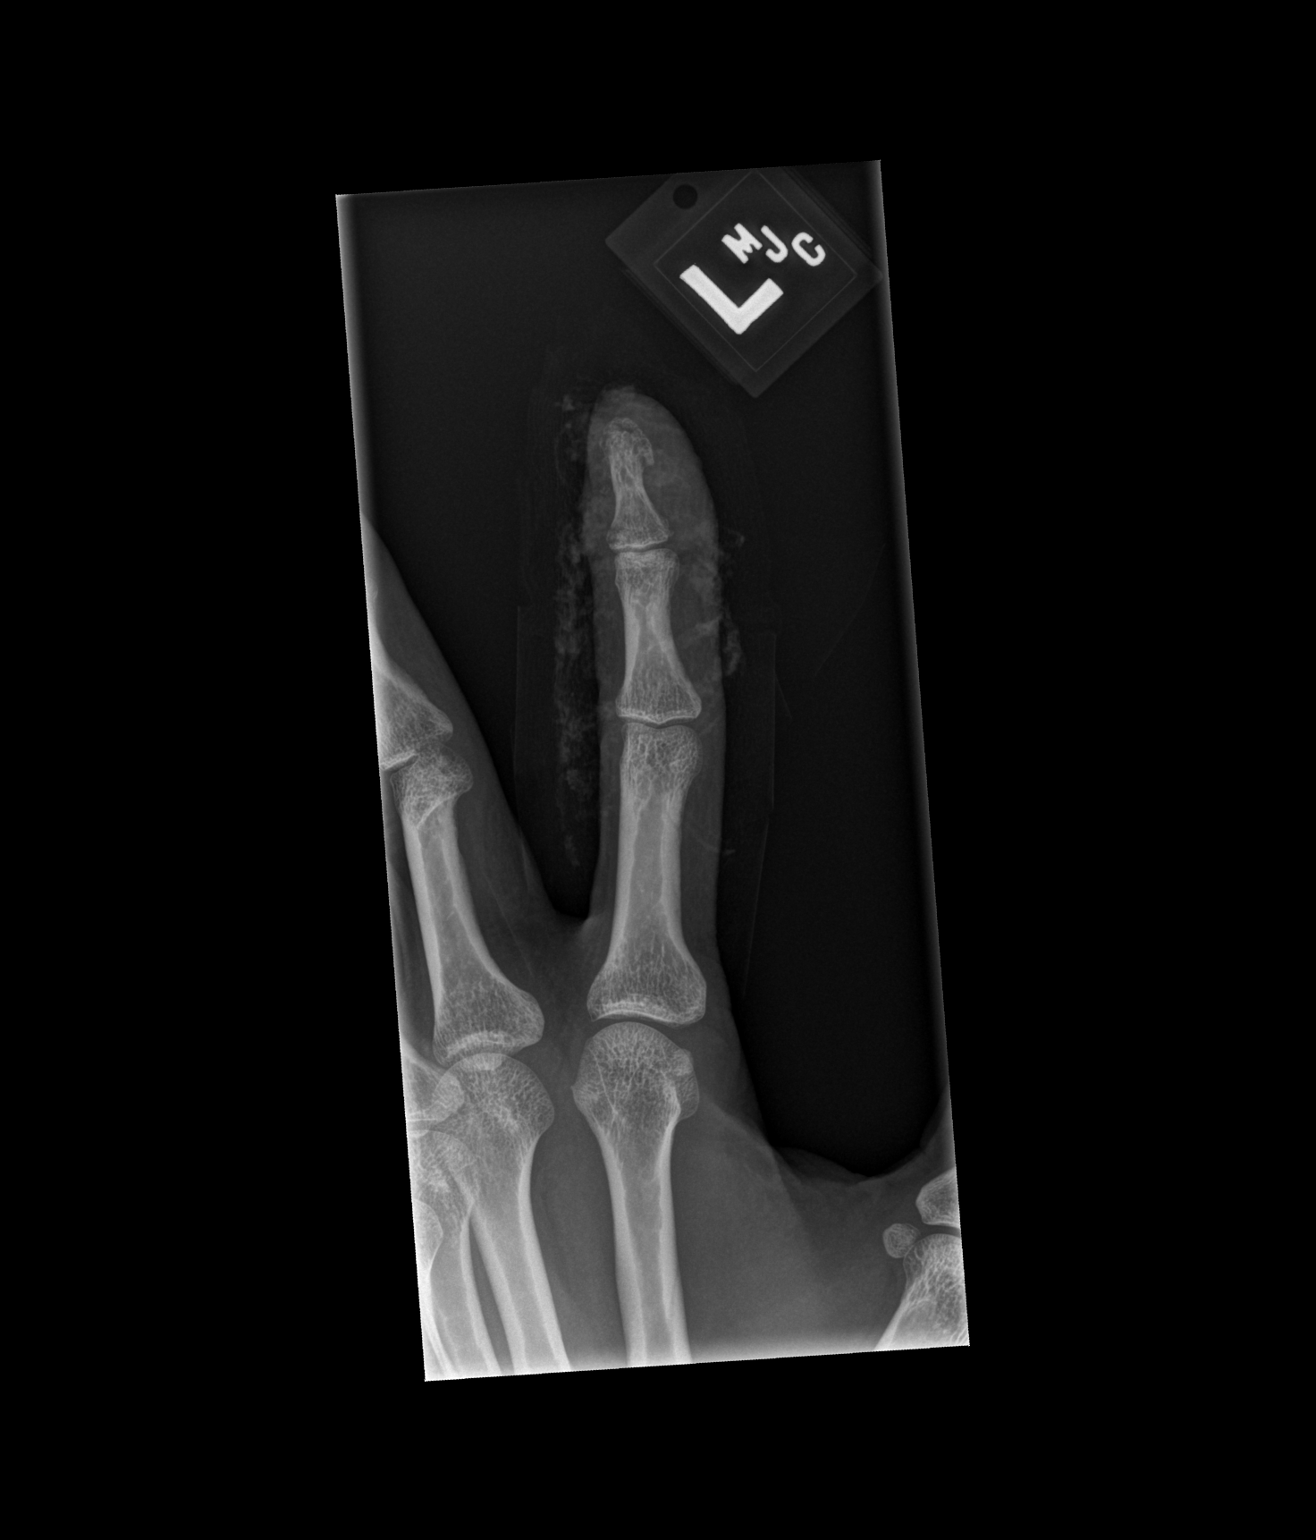

[x finger lat left]
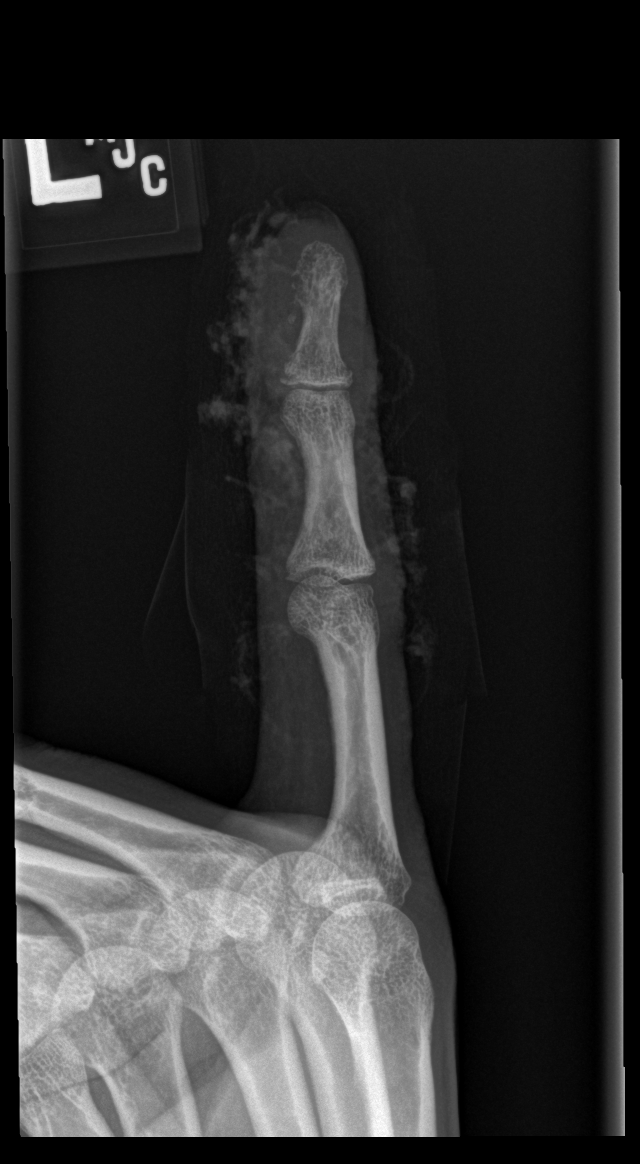

[3 of 3 positions shown; findings below may reference images not displayed]

FINDINGS: Cortical irregularity along the base of the distal phalanx
concerning for a minimally displaced fracture which may involve the
articular surface. Fine osseous and soft tissue detail is obscured
by overlying bandage material.

:
Cortical irregularity along the base of the distal phalanx
concerning for minimally displaced fracture which may involve the
articular surface. Correlate with point tenderness. Repeat
radiographs may be obtained in 7-10 days if further confirmation is
needed.

## 2019-12-03 ENCOUNTER — Ambulatory Visit: Payer: Self-pay | Attending: Internal Medicine

## 2019-12-03 DIAGNOSIS — Z23 Encounter for immunization: Secondary | ICD-10-CM

## 2019-12-03 NOTE — Progress Notes (Signed)
   Covid-19 Vaccination Clinic  Name:  KEISEAN SKOWRON    MRN: [id number] DOB: 11/10/1975  12/03/2019  Mr. Rapaport was observed post Covid-19 immunization for 15 minutes without incident. He was provided with Vaccine Information Sheet and instruction to access the V-Safe system.   Mr. Solly was instructed to call 911 with any severe reactions post vaccine: Marland Kitchen Difficulty breathing  . Swelling of face and throat  . A fast heartbeat  . A bad rash all over body  . Dizziness and weakness   Immunizations Administered    Name Date Dose VIS Date Route   Pfizer COVID-19 Vaccine 12/03/2019 10:02 AM 0.3 mL 08/27/2019 Intramuscular   Manufacturer: [employer], [employer]   Lot: [id number]   NDC: [address]-6

## 2019-12-29 ENCOUNTER — Ambulatory Visit: Payer: Self-pay | Attending: Internal Medicine

## 2019-12-29 DIAGNOSIS — Z23 Encounter for immunization: Secondary | ICD-10-CM

## 2019-12-29 NOTE — Progress Notes (Signed)
   Covid-19 Vaccination Clinic  Name:  Nathan Kent    MRN: [id number] DOB: [birth year]  12/29/2019  [name] was observed post Covid-19 immunization for 15 minutes without incident. He was provided with Vaccine Information Sheet and instruction to access the V-Safe system.   [name] was instructed to call 911 with any severe reactions post vaccine: Marland Kitchen Difficulty breathing  . Swelling of face and throat  . A fast heartbeat  . A bad rash all over body  . Dizziness and weakness   Immunizations Administered    Name Date Dose VIS Date Route   Pfizer COVID-19 Vaccine 12/29/2019  3:07 PM 0.3 mL 08/27/2019 Intramuscular   Manufacturer: [employer], [employer]   Lot: [id number]   NDC: [address]-8

## 2020-08-19 ENCOUNTER — Ambulatory Visit: Payer: Self-pay

## 2020-09-02 ENCOUNTER — Ambulatory Visit: Payer: BLUE CROSS/BLUE SHIELD | Attending: Internal Medicine

## 2020-09-02 DIAGNOSIS — Z23 Encounter for immunization: Secondary | ICD-10-CM

## 2020-09-02 NOTE — Progress Notes (Signed)
   Covid-19 Vaccination Clinic  Name:  Nathan Kent    MRN: [id number] DOB: 1976/03/31  09/02/2020  Mr. Nathan Kent was observed post Covid-19 immunization for 15 minutes without incident. He was provided with Vaccine Information Sheet and instruction to access the V-Safe system.   [name] was instructed to call 911 with any severe reactions post vaccine: Marland Kitchen Difficulty breathing  . Swelling of face and throat  . A fast heartbeat  . A bad rash all over body  . Dizziness and weakness   Immunizations Administered    Name Date Dose VIS Date Route   Pfizer COVID-19 Vaccine 09/02/2020  1:10 PM 0.3 mL 07/05/2020 Intramuscular   Manufacturer: [employer], [employer]   Lot: [id number]   NDC: [address]-5

## 2020-12-28 ENCOUNTER — Other Ambulatory Visit: Payer: Self-pay | Admitting: Chiropractic Medicine

## 2020-12-28 ENCOUNTER — Ambulatory Visit
Admission: RE | Admit: 2020-12-28 | Discharge: 2020-12-28 | Disposition: A | Discharge status: Home / Self Care | Payer: BLUE CROSS/BLUE SHIELD | Source: Ambulatory Visit | Attending: Chiropractic Medicine | Admitting: Chiropractic Medicine

## 2020-12-28 DIAGNOSIS — M542 Cervicalgia: Secondary | ICD-10-CM
